# Patient Record
Sex: Male | Born: 1965 | Race: White | Hispanic: No | Marital: Single | Smoking: Never smoker
Health system: Southern US, Community
[De-identification: ages and names within clinical notes are randomized; demographics above are authoritative.]

## PROBLEM LIST (undated history)

## (undated) DIAGNOSIS — I10 Essential (primary) hypertension: Secondary | ICD-10-CM

## (undated) HISTORY — PX: TOTAL HIP ARTHROPLASTY: SHX124

---

## 2018-02-14 ENCOUNTER — Emergency Department
Admission: EM | Admit: 2018-02-14 | Discharge: 2018-02-14 | Disposition: A | Discharge status: Home / Self Care | Payer: Self-pay | Attending: Emergency Medicine | Admitting: Emergency Medicine

## 2018-02-14 ENCOUNTER — Other Ambulatory Visit: Payer: Self-pay

## 2018-02-14 ENCOUNTER — Emergency Department: Payer: Self-pay

## 2018-02-14 DIAGNOSIS — Y9289 Other specified places as the place of occurrence of the external cause: Secondary | ICD-10-CM | POA: Insufficient documentation

## 2018-02-14 DIAGNOSIS — Y9369 Activity, other involving other sports and athletics played as a team or group: Secondary | ICD-10-CM | POA: Insufficient documentation

## 2018-02-14 DIAGNOSIS — S81812A Laceration without foreign body, left lower leg, initial encounter: Secondary | ICD-10-CM | POA: Insufficient documentation

## 2018-02-14 DIAGNOSIS — W01198A Fall on same level from slipping, tripping and stumbling with subsequent striking against other object, initial encounter: Secondary | ICD-10-CM | POA: Insufficient documentation

## 2018-02-14 DIAGNOSIS — Y998 Other external cause status: Secondary | ICD-10-CM | POA: Insufficient documentation

## 2018-02-14 HISTORY — DX: Essential (primary) hypertension: I10

## 2018-02-14 MED ORDER — TETANUS-DIPHTH-ACELL PERTUSSIS 5-2.5-18.5 LF-MCG/0.5 IM SUSP: 0.5000 mL | Freq: Once | INTRAMUSCULAR | Status: DC

## 2018-02-14 MED ORDER — LIDOCAINE HCL (PF) 1 % IJ SOLN: 5.0000 mL | Freq: Once | INTRAMUSCULAR | Status: DC

## 2018-02-14 MED ORDER — HYDROCODONE-ACETAMINOPHEN 5-325 MG PO TABS: 1.0000 | ORAL_TABLET | Freq: Three times a day (TID) | ORAL | 0 refills | Status: AC | PRN

## 2018-02-14 MED ORDER — LIDOCAINE HCL (PF) 1 % IJ SOLN
10.0000 mL | Freq: Once | INTRAMUSCULAR | Status: DC
  Filled 2018-02-14: qty 10

## 2018-02-14 MED ORDER — SULFAMETHOXAZOLE-TRIMETHOPRIM 800-160 MG PO TABS: 1.0000 | ORAL_TABLET | Freq: Two times a day (BID) | ORAL | 0 refills | Status: AC

## 2018-02-14 NOTE — Discharge Instructions (Signed)
Keep the wound clean, dry, and covered. Clean with soap & water only. See your provider for suture removal in 10-14 days.

## 2018-02-14 NOTE — ED Triage Notes (Signed)
Pt was playing disc golf on a hill. Lac noted to L side of front of L shin. Bleeding controlled in triage. No blood thinners. A&O, ambulatory.

## 2018-02-14 NOTE — ED Provider Notes (Signed)
[facility] Emergency Department Provider Note ____________________________________________  Time seen: 49  I have reviewed the triage vital signs and the nursing notes.  HISTORY  Chief Complaint  Laceration  HPI Arthur Blankenship is a 52 y.o. male presents himself to the ED for evaluation of an axonal laceration to his left calf.  Patient was at a disc golf tournament when he accidentally slipped and fell.  He apparently landed on a small twig that was treated from the ground.  He sustained laceration to the left lateral calf exposing the underlying muscle.  He denies any other injury at this time.  He reports his tetanus is up-to-date as of 6 months prior, during a total hip arthroplasty.  He denies any other injury at this time.  Past Medical History:  Diagnosis Date  . Hypertension     There are no active problems to display for this patient.  Prior to Admission medications   Medication Sig Start Date End Date Taking? Authorizing Provider  HYDROcodone-acetaminophen (NORCO) 5-325 MG tablet Take 1 tablet by mouth 3 (three) times daily as needed for up to 2 days. 02/14/18 02/16/18  Kimerly Rowand, Charlesetta Ivory, PA-C  sulfamethoxazole-trimethoprim (BACTRIM DS,SEPTRA DS) 800-160 MG tablet Take 1 tablet by mouth 2 (two) times daily for 10 days. 02/14/18 02/24/18  Paw Karstens, Charlesetta Ivory, PA-C    Allergies Patient has no known allergies.  No family history on file.  Social History Social History   Tobacco Use  . Smoking status: Never Smoker  Substance Use Topics  . Alcohol use: Yes  . Drug use: Not on file    Review of Systems  Constitutional: Negative for fever. Cardiovascular: Negative for chest pain. Respiratory: Negative for shortness of breath. Musculoskeletal: Negative for back pain. Skin: Negative for rash.  Left calf laceration as above. Neurological: Negative for headaches, focal weakness or  numbness. ____________________________________________  PHYSICAL EXAM:  VITAL SIGNS: ED Triage Vitals  Enc Vitals Group     BP 02/14/18 1259 113/67     Pulse Rate 02/14/18 1259 79     Resp 02/14/18 1259 18     Temp 02/14/18 1259 98.6 F (37 C)     Temp Source 02/14/18 1259 Oral     SpO2 02/14/18 1259 97 %     Weight 02/14/18 1259 225 lb (102.1 kg)     Height 02/14/18 1259 5\' 10"  (1.778 m)     Head Circumference --      Peak Flow --      Pain Score 02/14/18 1312 6     Pain Loc --      Pain Edu? --      Excl. in GC? --     Constitutional: Alert and oriented. Well appearing and in no distress. Head: Normocephalic and atraumatic. Eyes: Conjunctivae are normal. Normal extraocular movements Cardiovascular: Normal rate, regular rhythm. Normal distal pulses. Respiratory: Normal respiratory effort. No wheezes/rales/rhonchi. Musculoskeletal: Nontender with normal range of motion in all extremities.  Neurologic:  Normal gait without ataxia. Normal speech and language. No gross focal neurologic deficits are appreciated. Skin:  Skin is warm, dry and intact. No rash noted.  Left lateral calf with a 6 cm laceration through the lateral aspect of the calf musculature.  There is some visible contamination with wood splinters noted. ____________________________________________   RADIOLOGY  Left Tibia/Fibula  IMPRESSION: Anterior LOWER leg soft tissue laceration.  No acute fracture. ____________________________________________  PROCEDURES  .Marland KitchenLaceration Repair Date/Time: 02/14/2018 5:26 PM Performed by: Juanetta Snow  Tomasa Blase, PA-C Authorized by: Lissa Hoard, PA-C   Consent:    Consent obtained:  Verbal   Consent given by:  Patient   Risks discussed:  Poor wound healing and infection   Alternatives discussed:  Delayed treatment Anesthesia (see MAR for exact dosages):    Anesthesia method:  Local infiltration   Local anesthetic:  Lidocaine 1% w/o epi Laceration  details:    Location:  Leg   Leg location:  L lower leg   Length (cm):  6 Repair type:    Repair type:  Intermediate Pre-procedure details:    Preparation:  Patient was prepped and draped in usual sterile fashion Exploration:    Wound extent: foreign bodies/material     Foreign bodies/material:  Wood    Contaminated: yes   Treatment:    Area cleansed with:  Betadine   Amount of cleaning:  Standard   Irrigation solution:  Sterile saline   Irrigation method:  Syringe   Visualized foreign bodies/material removed: yes   Subcutaneous repair:    Suture size:  4-0   Suture material:  Vicryl   Suture technique:  Running   Number of sutures:  1 Skin repair:    Repair method:  Sutures   Suture size:  3-0   Suture material:  Nylon   Suture technique:  Simple interrupted   Number of sutures:  7 Approximation:    Approximation:  Close Post-procedure details:    Dressing:  Non-adherent dressing, bulky dressing and tube gauze   Patient tolerance of procedure:  Tolerated well, no immediate complications   ____________________________________________  INITIAL IMPRESSION / ASSESSMENT AND PLAN / ED COURSE  Patient with ED evaluation of an accidental laceration to the left calf.  Patient sustained a large laceration after he fell on a twig during a disc golf tournament.  He presents now for wound repair.  Wound is repaired with good coaptation and a 2 layer closure.  Patient is discharged with wound care instructions.  Will follow with his primary provider in 10 to 14 days for suture removal.  A prescription for Keflex is provided for prophylactic infection prevention.  A small prescription for hydrocodone (#6) was provided for pain relief. ____________________________________________  FINAL CLINICAL IMPRESSION(S) / ED DIAGNOSES  Final diagnoses:  Laceration of left lower extremity, initial encounter      Lissa Hoard, PA-C 02/14/18 1730    Jeanmarie Plant, MD 02/25/18  [phone]

## 2018-02-14 NOTE — ED Notes (Signed)
Laceration/puncture injury falling down a hill onto a branch that puncture leg. xr ordered. Tetanus possibly in April, but unsure.

## 2019-07-24 IMAGING — DX DG TIBIA/FIBULA 2V*L*
4 series · 4 of 4 positions shown · non-contrast
Comparison: None.

CLINICAL DATA: Pt was playing disc golf on [place]. Pt fell and
lacerated his left lower leg on a branch. Laceration noted to
anterior L aspect of left lower leg. Bleeding controlled in triage.
No blood thinners.

EXAM:
LEFT TIBIA AND FIBULA - 2 VIEW

[tibia ap (1 of 2)]
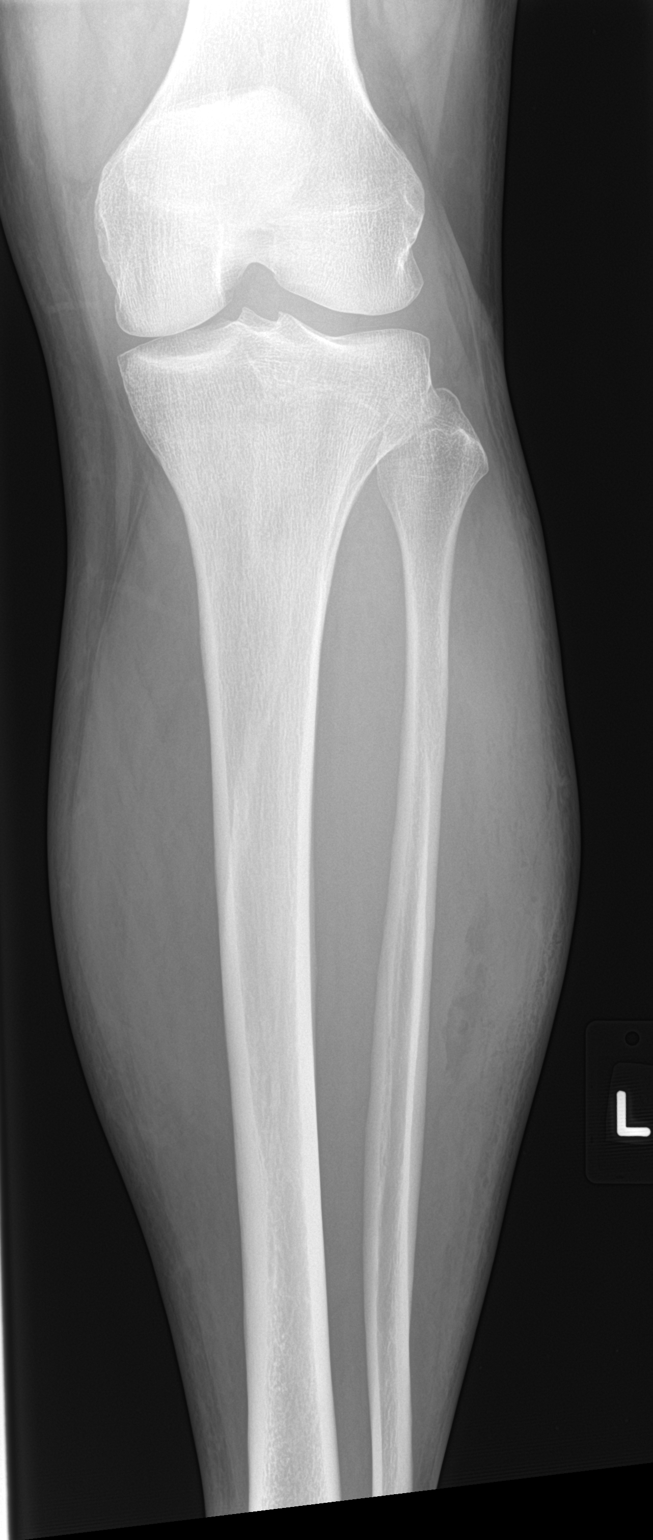

[tibia ap (2 of 2)]
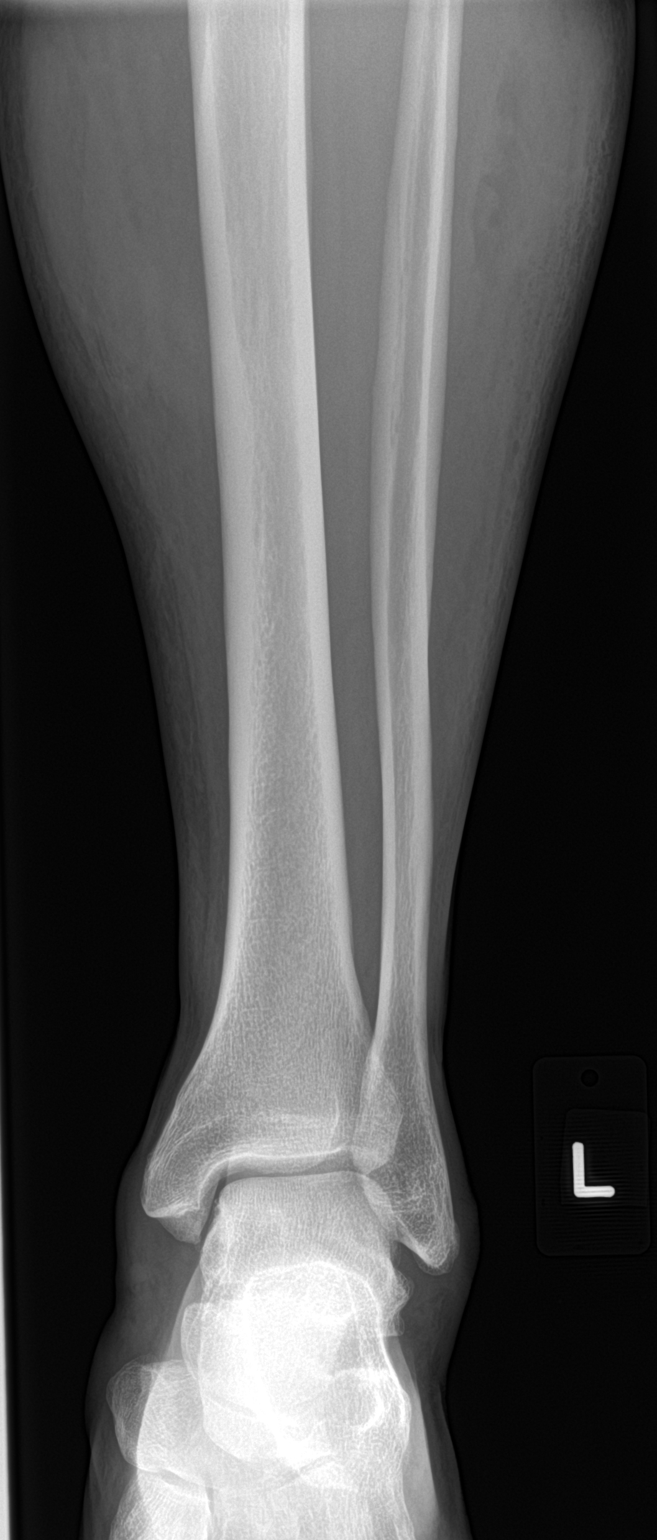

[tibia lat (1 of 2)]
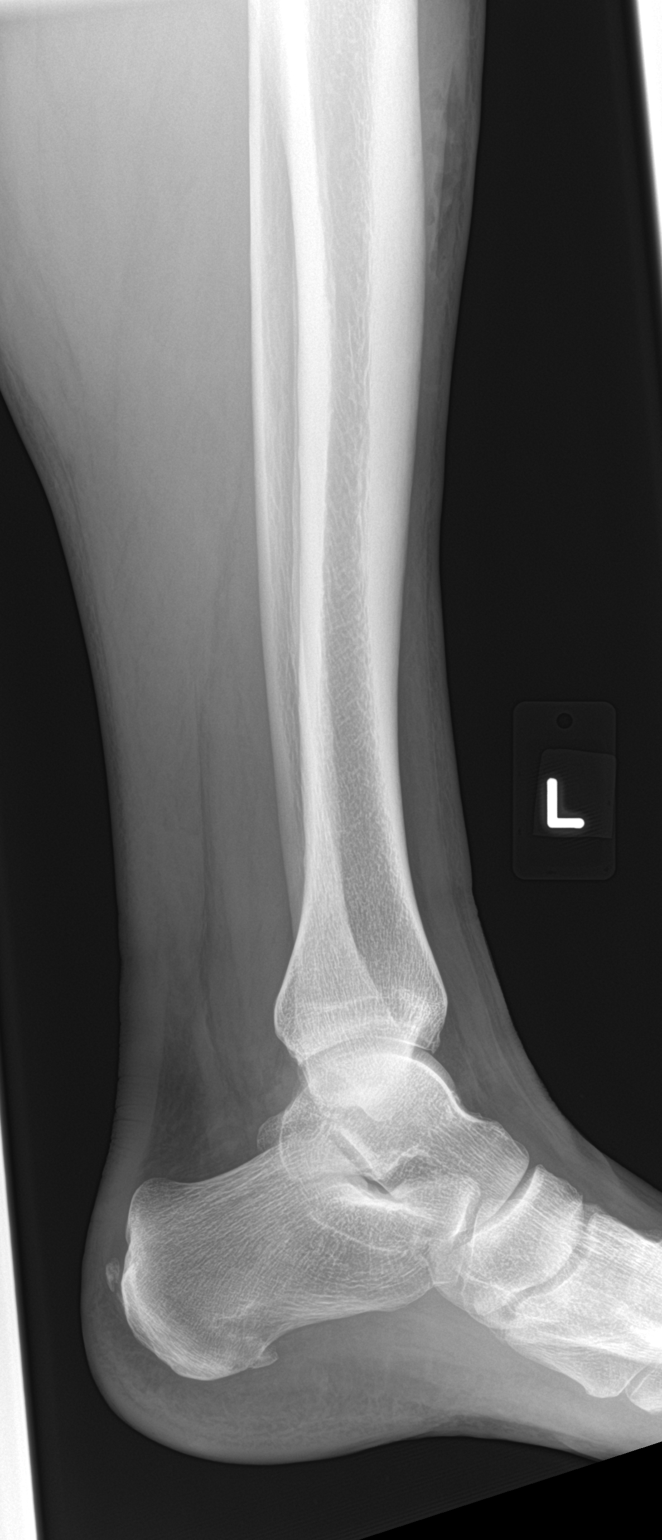

[tibia lat (2 of 2)]
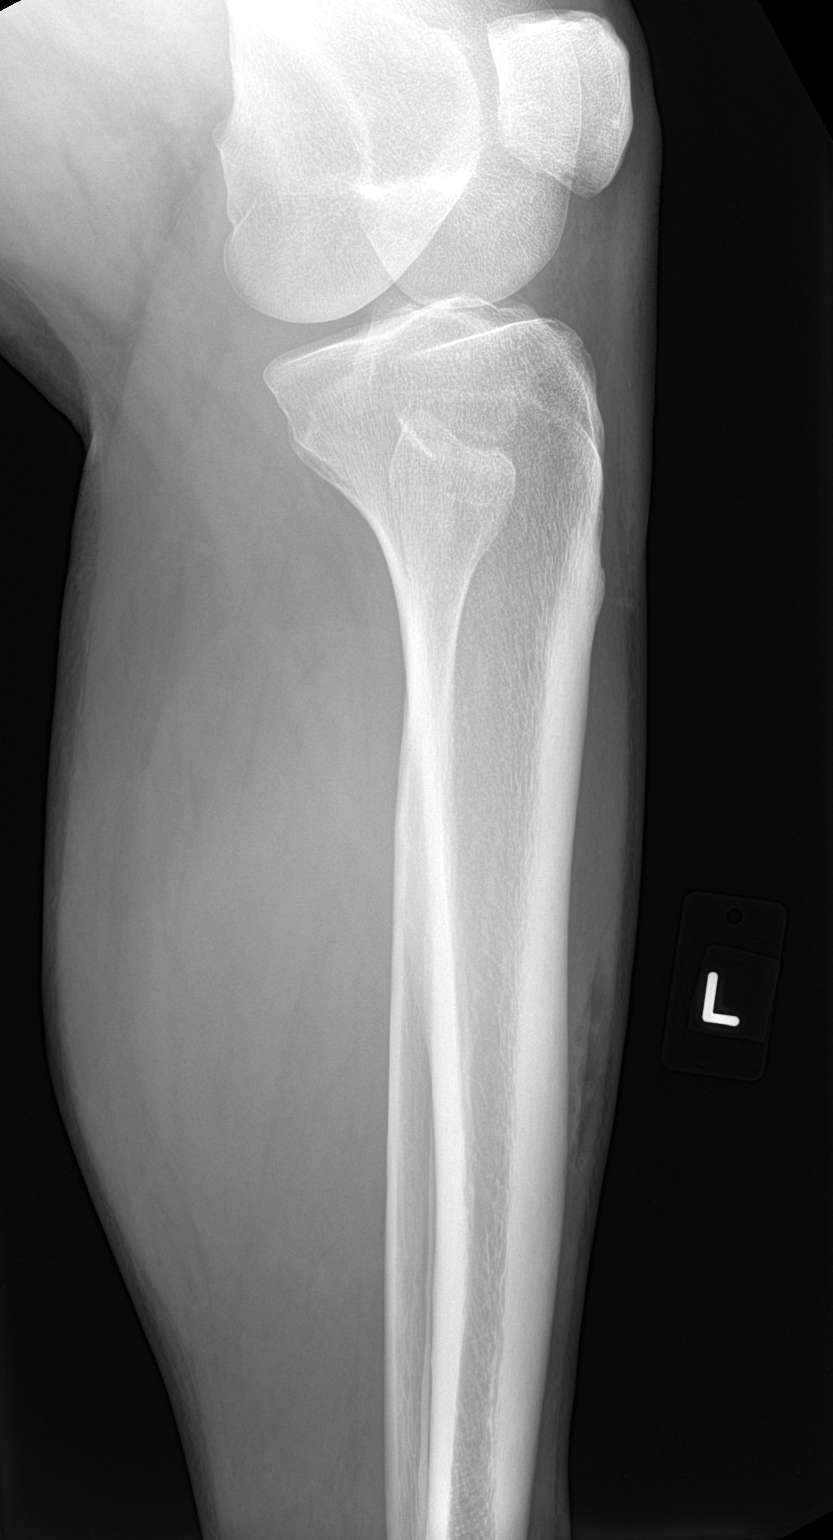

[4 of 4 positions shown; findings below may reference images not displayed]

FINDINGS: There is soft tissue irregularity along the anterior aspect of the
mid tibia, consistent with known laceration. There is no associated
radiopaque foreign body in this region. No acute fracture or
subluxation.
IMPRESSION: Anterior LOWER leg soft tissue laceration.  No acute fracture.
